# Patient Record
Sex: Male | Born: 1995 | Hispanic: Yes | Marital: Single | Smoking: Never smoker
Health system: Southern US, Community
[De-identification: ages and names within clinical notes are randomized; demographics above are authoritative.]

## PROBLEM LIST (undated history)

## (undated) HISTORY — PX: FRACTURE SURGERY: SHX138

---

## 2018-04-02 ENCOUNTER — Other Ambulatory Visit: Payer: Self-pay

## 2018-04-02 ENCOUNTER — Emergency Department
Admission: EM | Admit: 2018-04-02 | Discharge: 2018-04-02 | Disposition: A | Discharge status: Home / Self Care | Payer: BLUE CROSS/BLUE SHIELD | Attending: Emergency Medicine | Admitting: Emergency Medicine

## 2018-04-02 ENCOUNTER — Encounter: Payer: Self-pay | Admitting: Emergency Medicine

## 2018-04-02 ENCOUNTER — Emergency Department: Payer: BLUE CROSS/BLUE SHIELD

## 2018-04-02 DIAGNOSIS — Y92410 Unspecified street and highway as the place of occurrence of the external cause: Secondary | ICD-10-CM | POA: Insufficient documentation

## 2018-04-02 DIAGNOSIS — Y999 Unspecified external cause status: Secondary | ICD-10-CM | POA: Insufficient documentation

## 2018-04-02 DIAGNOSIS — M549 Dorsalgia, unspecified: Secondary | ICD-10-CM | POA: Diagnosis not present

## 2018-04-02 DIAGNOSIS — S161XXA Strain of muscle, fascia and tendon at neck level, initial encounter: Secondary | ICD-10-CM

## 2018-04-02 DIAGNOSIS — S46912A Strain of unspecified muscle, fascia and tendon at shoulder and upper arm level, left arm, initial encounter: Secondary | ICD-10-CM | POA: Diagnosis not present

## 2018-04-02 DIAGNOSIS — Y9389 Activity, other specified: Secondary | ICD-10-CM | POA: Insufficient documentation

## 2018-04-02 DIAGNOSIS — S199XXA Unspecified injury of neck, initial encounter: Secondary | ICD-10-CM | POA: Diagnosis present

## 2018-04-02 MED ORDER — IBUPROFEN 600 MG PO TABS: 600.0000 mg | ORAL_TABLET | Freq: Three times a day (TID) | ORAL | 0 refills | Status: AC | PRN

## 2018-04-02 MED ORDER — IBUPROFEN 600 MG PO TABS
600.0000 mg | ORAL_TABLET | Freq: Once | ORAL | Status: AC
  Administered 2018-04-02: 600 mg via ORAL
  Filled 2018-04-02: qty 1

## 2018-04-02 MED ORDER — METHOCARBAMOL 500 MG PO TABS: 500.0000 mg | ORAL_TABLET | Freq: Four times a day (QID) | ORAL | 0 refills | Status: AC | PRN
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
## 2018-04-02 NOTE — Discharge Instructions (Addendum)
Follow-up with your primary care provider or urgent care if any continued problems.  He will most likely be sore for the next 4 to 5 days.  Begin taking ibuprofen 600 mg 3 times daily with food.  You may also take Robaxin 500 mg every 6 hours as needed for muscle spasms.  Be aware that you cannot take the muscle relaxant while driving or operating machinery as it could cause drowsiness and increase your risk for injury.  You may also take Tylenol with this medication if needed for discomfort.  Also you may use moist heat or ice packs to your muscles as needed for discomfort. Also have your blood pressure rechecked by your primary care provider as your blood pressure was 165/104 in the ED.
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
